# Patient Record
Sex: Female | Born: 1935 | Race: White | Hispanic: No | Smoking: Former smoker
Health system: Southern US, Community
[De-identification: ages and names within clinical notes are randomized; demographics above are authoritative.]

## PROBLEM LIST (undated history)

## (undated) DIAGNOSIS — E119 Type 2 diabetes mellitus without complications: Secondary | ICD-10-CM

## (undated) DIAGNOSIS — J449 Chronic obstructive pulmonary disease, unspecified: Secondary | ICD-10-CM

## (undated) HISTORY — PX: ABDOMINAL HYSTERECTOMY: SHX81

## (undated) HISTORY — PX: APPENDECTOMY: SHX54

## (undated) HISTORY — PX: OTHER SURGICAL HISTORY: SHX169

---

## 2018-09-23 ENCOUNTER — Emergency Department (HOSPITAL_COMMUNITY)
Admission: EM | Admit: 2018-09-23 | Discharge: 2018-09-23 | Disposition: A | Discharge status: Home / Self Care | Payer: Medicare Other | Attending: Emergency Medicine | Admitting: Emergency Medicine

## 2018-09-23 ENCOUNTER — Emergency Department (HOSPITAL_COMMUNITY): Payer: Medicare Other

## 2018-09-23 ENCOUNTER — Other Ambulatory Visit: Payer: Self-pay

## 2018-09-23 ENCOUNTER — Encounter (HOSPITAL_COMMUNITY): Payer: Self-pay

## 2018-09-23 DIAGNOSIS — J441 Chronic obstructive pulmonary disease with (acute) exacerbation: Secondary | ICD-10-CM | POA: Diagnosis not present

## 2018-09-23 DIAGNOSIS — R531 Weakness: Secondary | ICD-10-CM

## 2018-09-23 DIAGNOSIS — Z87891 Personal history of nicotine dependence: Secondary | ICD-10-CM | POA: Diagnosis not present

## 2018-09-23 DIAGNOSIS — E119 Type 2 diabetes mellitus without complications: Secondary | ICD-10-CM | POA: Diagnosis not present

## 2018-09-23 DIAGNOSIS — Z7984 Long term (current) use of oral hypoglycemic drugs: Secondary | ICD-10-CM | POA: Diagnosis not present

## 2018-09-23 DIAGNOSIS — Z79899 Other long term (current) drug therapy: Secondary | ICD-10-CM | POA: Diagnosis not present

## 2018-09-23 DIAGNOSIS — Z7901 Long term (current) use of anticoagulants: Secondary | ICD-10-CM | POA: Insufficient documentation

## 2018-09-23 HISTORY — DX: Type 2 diabetes mellitus without complications: E11.9

## 2018-09-23 HISTORY — DX: Chronic obstructive pulmonary disease, unspecified: J44.9

## 2018-09-23 LAB — CBC WITH DIFFERENTIAL/PLATELET
Abs Immature Granulocytes: 0.03 10*3/uL (ref 0.00–0.07)
BASOS ABS: 0 10*3/uL (ref 0.0–0.1)
Basophils Relative: 1 %
EOS ABS: 0.2 10*3/uL (ref 0.0–0.5)
EOS PCT: 2 %
HEMATOCRIT: 40.8 % (ref 36.0–46.0)
Hemoglobin: 13 g/dL (ref 12.0–15.0)
Immature Granulocytes: 0 %
Lymphocytes Relative: 14 %
Lymphs Abs: 1.2 10*3/uL (ref 0.7–4.0)
MCH: 30 pg (ref 26.0–34.0)
MCHC: 31.9 g/dL (ref 30.0–36.0)
MCV: 94.2 fL (ref 80.0–100.0)
MONO ABS: 1 10*3/uL (ref 0.1–1.0)
Monocytes Relative: 12 %
NRBC: 0 % (ref 0.0–0.2)
Neutro Abs: 6.4 10*3/uL (ref 1.7–7.7)
Neutrophils Relative %: 71 %
Platelets: 276 10*3/uL (ref 150–400)
RBC: 4.33 MIL/uL (ref 3.87–5.11)
RDW: 12.4 % (ref 11.5–15.5)
WBC: 8.9 10*3/uL (ref 4.0–10.5)

## 2018-09-23 LAB — COMPREHENSIVE METABOLIC PANEL
ALK PHOS: 25 U/L — AB (ref 38–126)
ALT: 14 U/L (ref 0–44)
ANION GAP: 8 (ref 5–15)
AST: 21 U/L (ref 15–41)
Albumin: 3.3 g/dL — ABNORMAL LOW (ref 3.5–5.0)
BUN: 11 mg/dL (ref 8–23)
CALCIUM: 9.3 mg/dL (ref 8.9–10.3)
CO2: 23 mmol/L (ref 22–32)
Chloride: 105 mmol/L (ref 98–111)
Creatinine, Ser: 1.08 mg/dL — ABNORMAL HIGH (ref 0.44–1.00)
GFR, EST AFRICAN AMERICAN: 54 mL/min — AB (ref >60)
GFR, EST NON AFRICAN AMERICAN: 46 mL/min — AB (ref >60)
Glucose, Bld: 190 mg/dL — ABNORMAL HIGH (ref 70–99)
Potassium: 4.1 mmol/L (ref 3.5–5.1)
Sodium: 136 mmol/L (ref 135–145)
TOTAL PROTEIN: 6.7 g/dL (ref 6.5–8.1)
Total Bilirubin: 1 mg/dL (ref 0.3–1.2)

## 2018-09-23 LAB — URINALYSIS, ROUTINE W REFLEX MICROSCOPIC
BILIRUBIN URINE: NEGATIVE
Bacteria, UA: NONE SEEN
Glucose, UA: 150 mg/dL — AB
KETONES UR: NEGATIVE mg/dL
Nitrite: NEGATIVE
PH: 5 (ref 5.0–8.0)
Protein, ur: NEGATIVE mg/dL
Specific Gravity, Urine: 1.023 (ref 1.005–1.030)

## 2018-09-23 LAB — I-STAT CG4 LACTIC ACID, ED
LACTIC ACID, VENOUS: 1.94 mmol/L — AB (ref 0.5–1.9)
LACTIC ACID, VENOUS: 1.03 mmol/L (ref 0.5–1.9)

## 2018-09-23 LAB — TROPONIN I: Troponin I: <0.03 ng/mL (ref <0.03)

## 2018-09-23 MED ORDER — PREDNISONE 20 MG PO TABS
60.0000 mg | ORAL_TABLET | Freq: Once | ORAL | Status: AC
  Administered 2018-09-23: 60 mg via ORAL
  Filled 2018-09-23: qty 3

## 2018-09-23 MED ORDER — SODIUM CHLORIDE 0.9 % IV BOLUS
1000.0000 mL | Freq: Once | INTRAVENOUS | Status: AC
  Administered 2018-09-23: 1000 mL via INTRAVENOUS

## 2018-09-23 MED ORDER — AMOXICILLIN-POT CLAVULANATE 875-125 MG PO TABS
1.0000 | ORAL_TABLET | Freq: Once | ORAL | Status: AC
  Administered 2018-09-23: 1 via ORAL
  Filled 2018-09-23: qty 1

## 2018-09-23 MED ORDER — PREDNISONE 20 MG PO TABS: ORAL_TABLET | ORAL | 0 refills | Status: AC

## 2018-09-23 MED ORDER — ALBUTEROL SULFATE (2.5 MG/3ML) 0.083% IN NEBU
2.5000 mg | INHALATION_SOLUTION | Freq: Once | RESPIRATORY_TRACT | Status: AC
  Administered 2018-09-23: 2.5 mg via RESPIRATORY_TRACT
  Filled 2018-09-23: qty 3

## 2018-09-23 MED ORDER — AMOXICILLIN-POT CLAVULANATE 875-125 MG PO TABS: 1.0000 | ORAL_TABLET | Freq: Two times a day (BID) | ORAL | 0 refills | Status: AC

## 2018-09-23 MED ORDER — ACETAMINOPHEN 325 MG PO TABS
650.0000 mg | ORAL_TABLET | Freq: Once | ORAL | Status: AC
  Administered 2018-09-23: 650 mg via ORAL
  Filled 2018-09-23: qty 2

## 2018-09-23 NOTE — ED Provider Notes (Signed)
[facility] EMERGENCY DEPARTMENT Provider Note   CSN: [id number] Arrival date & time: 09/23/18  1628     History   Chief Complaint Chief Complaint  Patient presents with  . Weakness  . Cough  . Shortness of Breath    HPI Alejandra Carr is a 84 y.o. female.  HPI  84 year old female with a history of COPD and diabetes presents with weakness.  History is taken from the patient only is no family is present.  She was unable to get out of bed which is new for her today.  She is been having a sinus infection which she describes as sinus congestion and sinus pressure in addition to a cough with green sputum for about 2 weeks.  No fever but she is short of breath.  No chest pain or vomiting.  No focal weakness, just no energy or strength.  Denies any significant headache besides the pressure in her sinuses.  Past Medical History:  Diagnosis Date  . COPD (chronic obstructive pulmonary disease) (HCC)   . Diabetes mellitus without complication (HCC)     There are no active problems to display for this patient.      OB History   None      Home Medications    Prior to Admission medications   Medication Sig Start Date End Date Taking? Authorizing Provider  acetaminophen (TYLENOL) 325 MG tablet Take 650 mg by mouth every 6 (six) hours as needed (pain).   Yes [provider]  albuterol (PROVENTIL) (2.5 MG/3ML) 0.083% nebulizer solution Take 2.5 mg by nebulization every 6 (six) hours as needed for wheezing or shortness of breath.   Yes [provider]  apixaban (ELIQUIS) 5 MG TABS tablet Take 5 mg by mouth 2 (two) times daily.   Yes [provider]  benzonatate (TESSALON) 100 MG capsule Take 100 mg by mouth 3 (three) times daily. 09/22/18  Yes [provider]  Calcium Carbonate-Vitamin D (CALCIUM-D PO) Take 1 tablet by mouth daily.   Yes [provider]  CINNAMON PO Take 1,000 mg by mouth daily with supper.   Yes [provider]  citalopram (CELEXA) 40 MG tablet Take 40 mg by mouth daily. 06/28/18  Yes [provider]  colesevelam (WELCHOL) 625 MG tablet Take 1,875 mg by mouth 2 (two) times daily. 07/13/18  Yes [provider]  fenofibrate 160 MG tablet Take 160 mg by mouth daily. 09/14/18  Yes [provider]  gabapentin (NEURONTIN) 100 MG capsule Take 100 mg by mouth 3 (three) times daily. 08/22/18  Yes [provider]  losartan (COZAAR) 25 MG tablet Take 25 mg by mouth at bedtime. 08/20/18  Yes [provider]  Omega-3 Fatty Acids (FISH OIL) 1000 MG CAPS Take 1,000 mg by mouth 2 (two) times daily.   Yes [provider]  omeprazole (PRILOSEC) 20 MG capsule Take 20 mg by mouth daily. 07/07/18  Yes [provider]  sitaGLIPtin (JANUVIA) 100 MG tablet Take 100 mg by mouth at bedtime.   Yes [provider]  amoxicillin-clavulanate (AUGMENTIN) 875-125 MG tablet Take 1 tablet by mouth 2 (two) times daily. One po bid x 7 days 09/23/18   Pricilla Loveless, MD  predniSONE (DELTASONE) 20 MG tablet 2 tabs po daily x 4 days 09/24/18   Pricilla Loveless, MD    Family History No family history on file.  Social History Social History   Tobacco Use  . Smoking status: Former Smoker  Last attempt to quit: 09/24/1995    Years since quitting: 23.0  . Smokeless tobacco: Never Used  Substance Use Topics  . Alcohol use: Never    Frequency: Never  . Drug use: Never     Allergies   Codeine and Lisinopril   Review of Systems Review of Systems  Constitutional: Positive for fatigue. Negative for fever.  HENT: Positive for congestion and sinus pressure.   Respiratory: Positive for cough and shortness of breath.   Cardiovascular: Negative for chest pain.  Gastrointestinal: Negative for abdominal pain, diarrhea and vomiting.  Genitourinary: Negative for dysuria.  Musculoskeletal: Positive for back pain (chronic).  Neurological: Positive for weakness.    All other systems reviewed and are negative.    Physical Exam Updated Vital Signs BP (!) 143/67   Pulse 82   Temp (!) 100.7 F (38.2 C) (Rectal)   Resp 16   Ht 5\' 6"  (1.676 m)   Wt 81.6 kg   SpO2 92%   BMI 29.05 kg/m   Physical Exam  Constitutional: She is oriented to person, place, and time. She appears well-developed and well-nourished.  Non-toxic appearance. She does not appear ill.  HENT:  Head: Normocephalic and atraumatic.  Right Ear: External ear normal.  Left Ear: External ear normal.  Nose: Nose normal.  Eyes: Pupils are equal, round, and reactive to light. EOM are normal. Right eye exhibits no discharge. Left eye exhibits no discharge.  Cardiovascular: Normal rate, regular rhythm and normal heart sounds.  Pulmonary/Chest: Effort normal. She has wheezes (diffuse, expiratory).  Abdominal: Soft. There is no tenderness.  Neurological: She is alert and oriented to person, place, and time.  CN 3-12 grossly intact. 5/5 strength in all 4 extremities. Grossly normal sensation. Normal finger to nose. However she cannot arise from a recumbent position to sitting without assistance  Skin: Skin is warm and dry.  Psychiatric: Her mood appears not anxious.  Nursing note and vitals reviewed.    ED Treatments / Results  Labs (all labs ordered are listed, but only abnormal results are displayed) Labs Reviewed  COMPREHENSIVE METABOLIC PANEL - Abnormal; Notable for the following components:      Result Value   Glucose, Bld 190 (*)    Creatinine, Ser 1.08 (*)    Albumin 3.3 (*)    Alkaline Phosphatase 25 (*)    GFR calc non Af Amer 46 (*)    GFR calc Af Amer 54 (*)    All other components within normal limits  URINALYSIS, ROUTINE W REFLEX MICROSCOPIC - Abnormal; Notable for the following components:   Glucose, UA 150 (*)    Hgb urine dipstick SMALL (*)    Leukocytes, UA SMALL (*)    All other components within normal limits  I-STAT CG4 LACTIC ACID, ED - Abnormal; Notable  for the following components:   Lactic Acid, Venous 1.94 (*)    All other components within normal limits  CULTURE, BLOOD (ROUTINE X 2)  CULTURE, BLOOD (ROUTINE X 2)  URINE CULTURE  TROPONIN I  CBC WITH DIFFERENTIAL/PLATELET  I-STAT CG4 LACTIC ACID, ED    EKG EKG Interpretation  Date/Time:  Sunday September 23 2018 16:38:59 EST Ventricular Rate:  90 PR Interval:    QRS Duration: 113 QT Interval:  356 QTC Calculation: 436 R Axis:   -10 Text Interpretation:  Sinus rhythm Borderline intraventricular conduction delay Abnormal R-wave progression, early transition Abnormal T, consider ischemia, lateral leads Baseline wander in lead(s) V2 No old tracing to compare Confirmed by  Pricilla Loveless [phone]) on 09/23/2018 4:43:02 PM   Radiology Dg Chest 2 View  Result Date: 09/23/2018 CLINICAL DATA:  Cough and dyspnea EXAM: CHEST - 2 VIEW COMPARISON:  None. FINDINGS: Normal heart size. Atherosclerotic aortic arch. Otherwise normal mediastinal contour. No pneumothorax. No pleural effusion. Lungs appear clear, with no acute consolidative airspace disease and no pulmonary edema. IMPRESSION: No active cardiopulmonary disease. Electronically Signed   By: Delbert Phenix M.D.   On: 09/23/2018 18:26    Procedures Procedures (including critical care time)  Medications Ordered in ED Medications  predniSONE (DELTASONE) tablet 60 mg (has no administration in time range)  acetaminophen (TYLENOL) tablet 650 mg (has no administration in time range)  amoxicillin-clavulanate (AUGMENTIN) 875-125 MG per tablet 1 tablet (has no administration in time range)  sodium chloride 0.9 % bolus 1,000 mL (1,000 mLs Intravenous New Bag/Given 09/23/18 1958)  albuterol (PROVENTIL) (2.5 MG/3ML) 0.083% nebulizer solution 2.5 mg (2.5 mg Nebulization Given 09/23/18 1725)     Initial Impression / Assessment and Plan / ED Course  I have reviewed the triage vital signs and the nursing notes.  Pertinent labs & imaging results that  were available during my care of the patient were reviewed by me and considered in my medical decision making (see chart for details).     There are no focal findings on patient's neuro exam.  She does have signs/symptoms of respiratory/sinus infection.  No pneumonia on chest x-ray.  Wheezing/shortness of breath seems to be improved with albuterol.  She does have COPD.  She could have a viral or bacterial sinus infection/COPD exacerbation.  This would help explain her low-grade fever of 100.7.  However at this time after fluids and breathing treatment she is able to get up and walk on her own without significant difficulty.  She feels well enough for home and husband/family agree.  I will draw blood cultures given the low-grade fever but she does not appear to have sepsis.  Lactate is under 2 and she has no hypotension or hypoxia.  No renal failure.  I will give Augmentin for questionable sinusitis versus COPD exacerbation.  She will need to follow-up closely with PCP.  We discussed strict return precautions.  Never has had any focal symptoms to suggest stroke or other acute neurologic emergency.  Final Clinical Impressions(s) / ED Diagnoses   Final diagnoses:  Generalized weakness  COPD exacerbation [facility])    ED Discharge Orders         Ordered    predniSONE (DELTASONE) 20 MG tablet     09/23/18 2146    amoxicillin-clavulanate (AUGMENTIN) 875-125 MG tablet  2 times daily     09/23/18 2146           Pricilla Loveless, MD 09/23/18 2157

## 2018-09-23 NOTE — ED Notes (Signed)
Patient transported to X-ray 

## 2018-09-23 NOTE — ED Notes (Signed)
[place] sandwich given with water.

## 2018-09-23 NOTE — Discharge Instructions (Addendum)
You were found to have a low-grade fever of 100.7 in the emergency department.  This is probably coming from your bronchitis/sinus infection.  This may be the reason why you were so weak earlier today.  You are being given antibiotics and steroids to help with this.  If your weakness worsens, you develop worse shortness of breath, he develop high fever, vomiting, or any other new/concerning symptoms and return to the ER for evaluation.  It is very important to follow closely with your primary care physician, and I recommend you do this in the next 1-2 days.

## 2018-09-26 LAB — URINE CULTURE: Culture: <10000 — AB

## 2018-09-28 LAB — CULTURE, BLOOD (ROUTINE X 2)
CULTURE: NO GROWTH
CULTURE: NO GROWTH

## 2019-05-01 ENCOUNTER — Ambulatory Visit
Admission: RE | Admit: 2019-05-01 | Discharge: 2019-05-01 | Disposition: A | Discharge status: Home / Self Care | Payer: Medicare Other | Source: Ambulatory Visit | Attending: Pain Medicine | Admitting: Pain Medicine

## 2019-05-01 ENCOUNTER — Other Ambulatory Visit: Payer: Self-pay

## 2019-05-01 ENCOUNTER — Other Ambulatory Visit: Payer: Self-pay | Admitting: Pain Medicine

## 2019-05-01 DIAGNOSIS — M545 Low back pain, unspecified: Secondary | ICD-10-CM

## 2020-04-30 IMAGING — CR LUMBAR SPINE - COMPLETE 4+ VIEW
5 series · 5 of 5 positions shown · non-contrast
Comparison: None.

CLINICAL DATA: Chronic lower back pain without known injury.

EXAM:
LUMBAR SPINE - COMPLETE 4+ VIEW

[t lumbar spine ap]
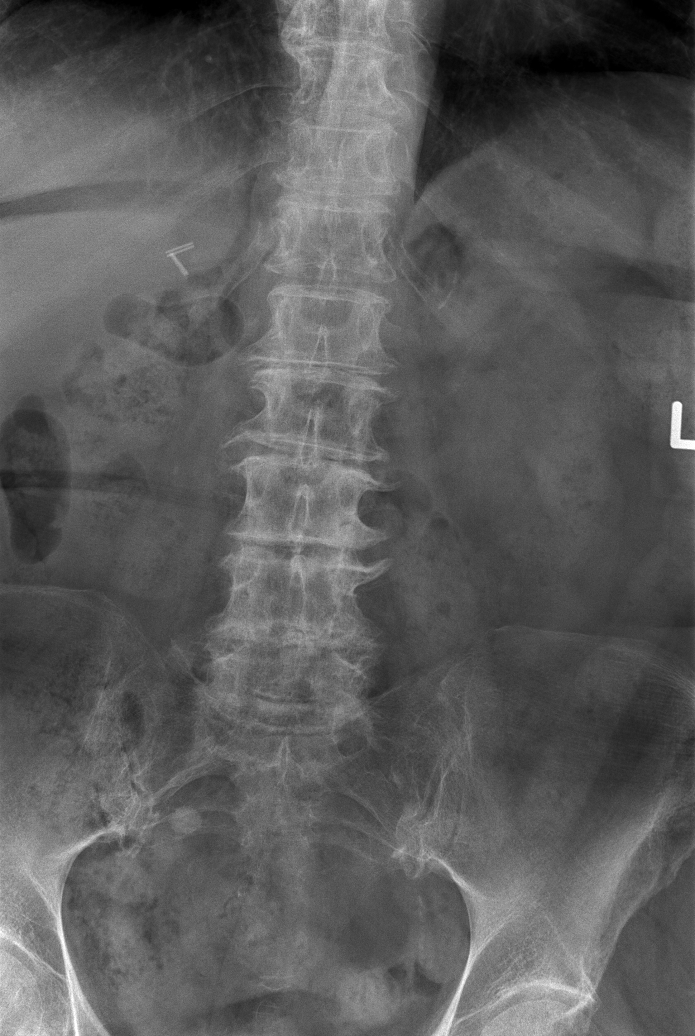

[t lumbar spine obl (1 of 2)]
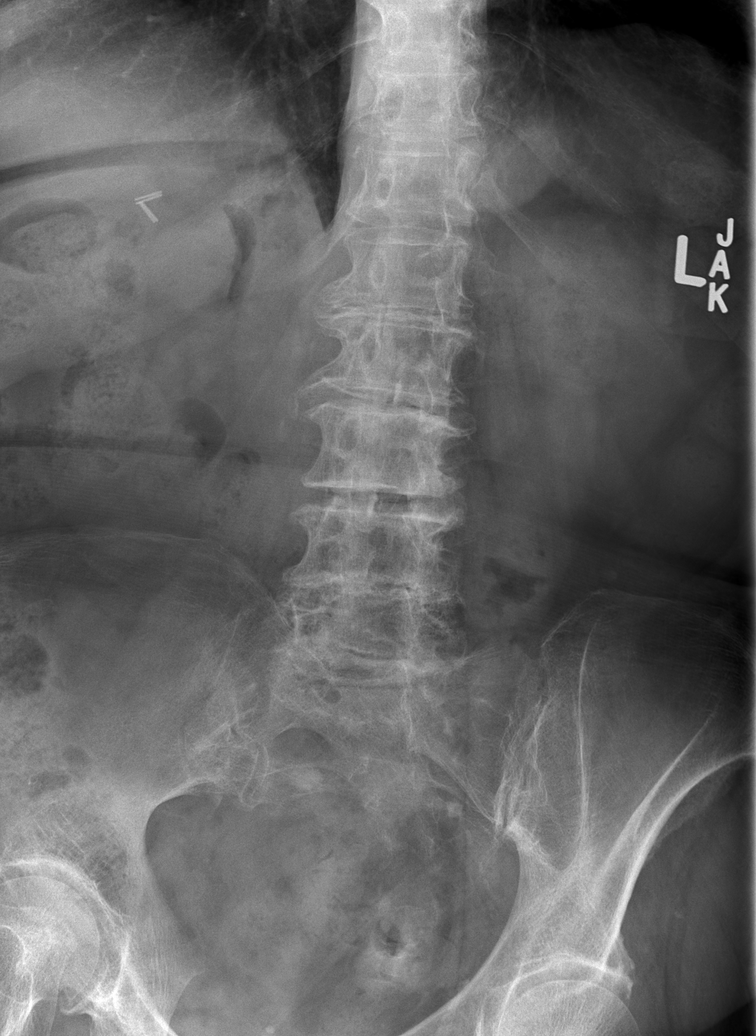

[t lumbar spine obl (2 of 2)]
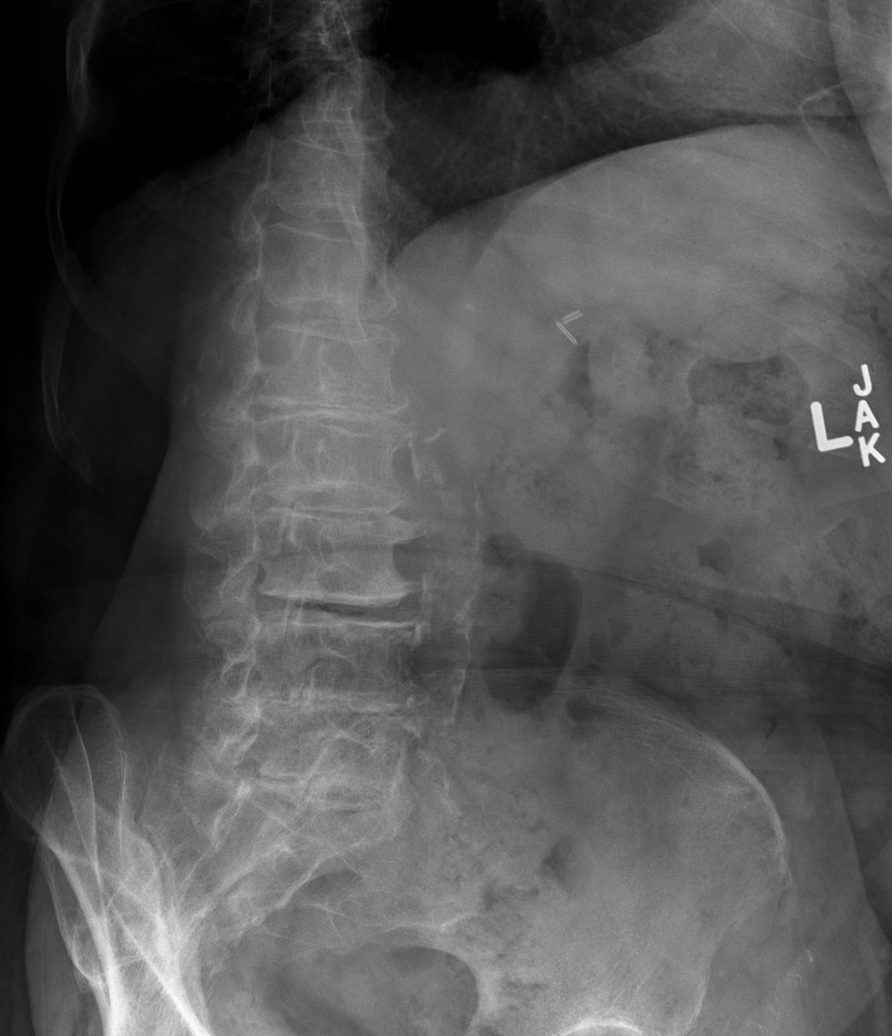

[t lumbar spine lat]
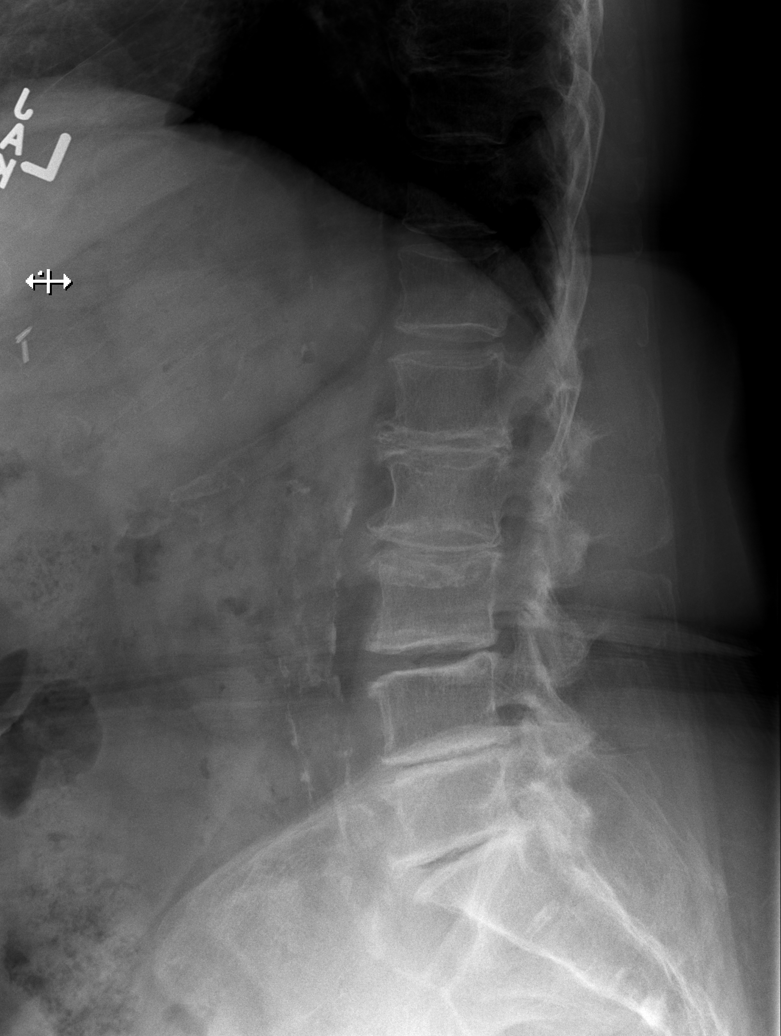

[t lumbar l-5 s-1 spot]
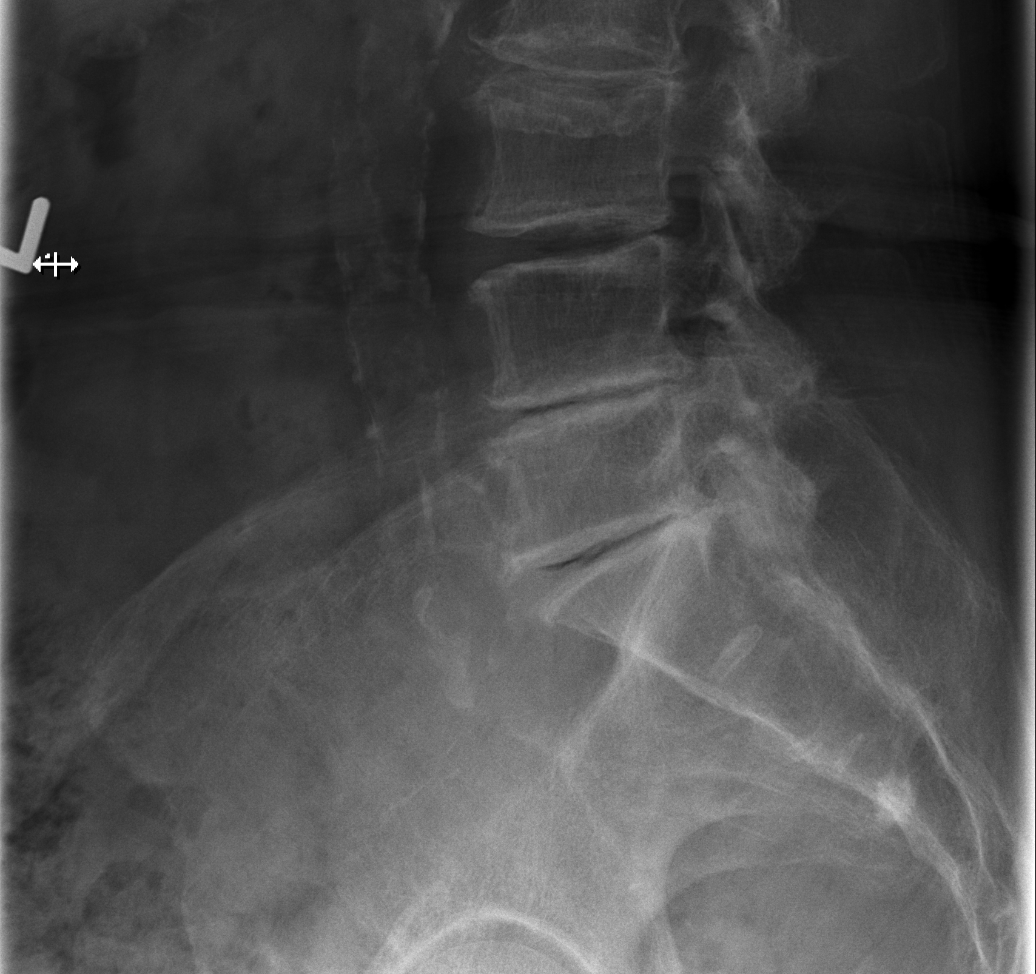

[5 of 5 positions shown; findings below may reference images not displayed]

FINDINGS: No fracture or spondylolisthesis is noted. Severe degenerative disc
disease is noted at L1-2, L2-3, L4-5 and L5-S1. Moderate
degenerative disc disease is noted at L3-4. Atherosclerosis of
abdominal aorta is noted.
IMPRESSION: Multilevel degenerative disc disease. No acute abnormality seen in
the lumbar spine.

Aortic Atherosclerosis ([id number]).
